# Patient Record
Sex: Male | Born: 1990 | Race: White | Hispanic: No | Marital: Single | Smoking: Never smoker
Health system: Southern US, Community
[De-identification: ages and names within clinical notes are randomized; demographics above are authoritative.]

## PROBLEM LIST (undated history)

## (undated) HISTORY — PX: KNEE SURGERY: SHX244

---
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
## 2011-02-19 ENCOUNTER — Encounter: Payer: Self-pay | Admitting: *Deleted

## 2011-02-19 ENCOUNTER — Emergency Department (HOSPITAL_COMMUNITY)
Admission: EM | Admit: 2011-02-19 | Discharge: 2011-02-19 | Disposition: A | Discharge status: Home / Self Care | Payer: Self-pay | Attending: Emergency Medicine | Admitting: Emergency Medicine

## 2011-02-19 ENCOUNTER — Emergency Department (HOSPITAL_COMMUNITY): Payer: Self-pay

## 2011-02-19 DIAGNOSIS — W230XXA Caught, crushed, jammed, or pinched between moving objects, initial encounter: Secondary | ICD-10-CM | POA: Insufficient documentation

## 2011-02-19 DIAGNOSIS — IMO0001 Reserved for inherently not codable concepts without codable children: Secondary | ICD-10-CM

## 2011-02-19 DIAGNOSIS — S6000XA Contusion of unspecified finger without damage to nail, initial encounter: Secondary | ICD-10-CM | POA: Insufficient documentation

## 2011-02-19 MED ORDER — HYDROCODONE-ACETAMINOPHEN 5-325 MG PO TABS: ORAL_TABLET | ORAL | Status: AC

## 2011-02-19 NOTE — ED Notes (Signed)
PA in with pt at this time  
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
## 2011-10-10 ENCOUNTER — Emergency Department (HOSPITAL_COMMUNITY)
Admission: EM | Admit: 2011-10-10 | Discharge: 2011-10-10 | Disposition: A | Discharge status: Home / Self Care | Payer: Self-pay | Attending: Emergency Medicine | Admitting: Emergency Medicine

## 2011-10-10 ENCOUNTER — Emergency Department (HOSPITAL_COMMUNITY): Payer: Self-pay

## 2011-10-10 ENCOUNTER — Encounter (HOSPITAL_COMMUNITY): Payer: Self-pay | Admitting: *Deleted

## 2011-10-10 DIAGNOSIS — M25519 Pain in unspecified shoulder: Secondary | ICD-10-CM | POA: Insufficient documentation

## 2011-10-10 MED ORDER — NAPROXEN 500 MG PO TABS: 500.0000 mg | ORAL_TABLET | Freq: Two times a day (BID) | ORAL | Status: DC

## 2011-10-10 NOTE — ED Notes (Signed)
MD at bedside. 
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
## 2011-10-10 NOTE — Discharge Instructions (Signed)
Shoulder Pain The shoulder is a ball and socket joint. The muscles and tendons (rotator cuff) are what keep the shoulder in its joint and stable. This collection of muscles and tendons holds in the head (ball) of the humerus (upper arm bone) in the fossa (cup) of the scapula (shoulder blade). Today no reason was found for your shoulder pain. Often pain in the shoulder may be treated conservatively with temporary immobilization. For example, holding the shoulder in one place using a sling for rest. Physical therapy may be needed if problems continue. HOME CARE INSTRUCTIONS   Apply ice to the sore area for 15 to 20 minutes, 3 to 4 times per day for the first 2 days. Put the ice in a plastic bag. Place a towel between the bag of ice and your skin.   If you have or were given a shoulder sling and straps, do not remove for as long as directed by your caregiver or until you see a caregiver for a follow-up examination. If you need to remove it to shower or bathe, move your arm as little as possible.   Sleep on several pillows at night to lessen swelling and pain.   Only take over-the-counter or prescription medicines for pain, discomfort, or fever as directed by your caregiver.   Keep any follow-up appointments in order to avoid any type of permanent shoulder disability or chronic pain problems.  SEEK MEDICAL CARE IF:   Pain in your shoulder increases or new pain develops in your arm, hand, or fingers.   Your hand or fingers are colder than your other hand.   You do not obtain pain relief with the medications or your pain becomes worse.  SEEK IMMEDIATE MEDICAL CARE IF:   Your arm, hand, or fingers are numb or tingling.   Your arm, hand, or fingers are swollen, painful, or turn white or blue.   You develop chest pain or shortness of breath.  MAKE SURE YOU:   Understand these instructions.   Will watch your condition.   Will get help right away if you are not doing well or get worse.    Document Released: 03/07/2005 Document Revised: 05/17/2011 Document Reviewed: 05/12/2011 [employer] Patient Information 2012 [employer]. 
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
## 2011-10-15 NOTE — ED Provider Notes (Signed)
Medical screening examination/treatment/procedure(s) were performed by non-physician practitioner and as supervising physician I was immediately available for consultation/collaboration.    Benny Lennert, MD 10/15/11 [phone]

## 2011-11-25 ENCOUNTER — Encounter (HOSPITAL_COMMUNITY): Payer: Self-pay

## 2011-11-25 ENCOUNTER — Emergency Department (HOSPITAL_COMMUNITY): Payer: Medicaid Other

## 2011-11-25 ENCOUNTER — Emergency Department (HOSPITAL_COMMUNITY)
Admission: EM | Admit: 2011-11-25 | Discharge: 2011-11-25 | Disposition: A | Discharge status: Home / Self Care | Payer: Medicaid Other | Attending: Emergency Medicine | Admitting: Emergency Medicine

## 2011-11-25 DIAGNOSIS — S92309A Fracture of unspecified metatarsal bone(s), unspecified foot, initial encounter for closed fracture: Secondary | ICD-10-CM | POA: Insufficient documentation

## 2011-11-25 DIAGNOSIS — Z880 Allergy status to penicillin: Secondary | ICD-10-CM | POA: Insufficient documentation

## 2011-11-25 MED ORDER — HYDROCODONE-ACETAMINOPHEN 5-325 MG PO TABS: ORAL_TABLET | ORAL | Status: DC

## 2011-11-25 MED ORDER — IBUPROFEN 800 MG PO TABS: 800.0000 mg | ORAL_TABLET | Freq: Three times a day (TID) | ORAL | Status: DC

## 2011-11-25 MED ORDER — HYDROCODONE-ACETAMINOPHEN 5-325 MG PO TABS
1.0000 | ORAL_TABLET | Freq: Once | ORAL | Status: AC
  Administered 2011-11-25: 1 via ORAL
  Filled 2011-11-25: qty 1

## 2011-11-25 MED ORDER — BACITRACIN ZINC 500 UNIT/GM EX OINT
TOPICAL_OINTMENT | CUTANEOUS | Status: AC
  Filled 2011-11-25: qty 0.9

## 2011-11-25 MED ORDER — DOUBLE ANTIBIOTIC 500-10000 UNIT/GM EX OINT: TOPICAL_OINTMENT | Freq: Once | CUTANEOUS | Status: DC

## 2011-11-25 MED ORDER — IBUPROFEN 800 MG PO TABS
800.0000 mg | ORAL_TABLET | Freq: Once | ORAL | Status: AC
  Administered 2011-11-25: 800 mg via ORAL
  Filled 2011-11-25: qty 1
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
## 2011-11-25 NOTE — Discharge Instructions (Signed)
Foot Fracture Your caregiver has diagnosed you as having a foot fracture (broken bone). Your foot has many bones. You have a fracture, or break, in one of these bones. In some cases, your doctor may put on a splint or removable fracture boot until the swelling in your foot has lessened. A cast may or may not be required. HOME CARE INSTRUCTIONS  If you do not have a cast or splint:  You may bear weight on your injured foot as tolerated or advised.   Do not put any weight on your injured foot for as long as directed by your caregiver. Slowly increase the amount of time you walk on the foot as the pain and swelling allows or as advised.   Use crutches until you can bear weight without pain. A gradual increase in weight bearing may help.   Apply ice to the injury for 15 to 20 minutes each hour while awake for the first 2 days. Put the ice in a plastic bag and place a towel between the bag of ice and your skin.   If an ace bandage (stretchy, elastic wrapping bandage) was applied, you may re-wrap it if ankle is more painful or your toes become cold and swollen.  If you have a cast or splint:  Use your crutches for as long as directed by your caregiver.   To lessen the swelling, keep the injured foot elevated on pillows while lying down or sitting. Elevate your foot above your heart.   Apply ice to the injury for 15 to 20 minutes each hour while awake for the first 2 days. Put the ice in a plastic bag and place a thin towel between the bag of ice and your cast.   Plaster or fiberglass cast:   Do not try to scratch the skin under the cast using a sharp or pointed object down the cast.   Check the skin around the cast every day. You may put lotion on any red or sore areas.   Keep your cast clean and dry.   Plaster splint:   Wear the splint until you are seen for a follow-up examination.   You may loosen the elastic around the splint if your toes become numb, tingle, or turn blue or cold. Do  not rest it on anything harder than a pillow in the first 24 hours.   Do not put pressure on any part of your splint. Use your crutches as directed.   Keep your splint dry. It can be protected during bathing with a plastic bag. Do not lower the splint into water.   If you have a fracture boot you may remove it to shower. Bear weight only as instructed by your caregiver.   Only take over-the-counter or prescription medicines for pain, discomfort, or fever as directed by your caregiver.  SEEK IMMEDIATE MEDICAL CARE IF:   Your cast gets damaged or breaks.   You have continued severe pain or more swelling than you did before the cast was put on.   Your skin or nails of your casted foot turn blue, gray, feel cold or numb.   There is a bad smell from your cast.   There is severe pain with movement of your toes.   There are new stains and/or drainage coming from under the cast.  MAKE SURE YOU:   Understand these instructions.   Will watch your condition.   Will get help right away if you are not doing well or get   worse.  Document Released: 05/25/2000 Document Revised: 05/17/2011 Document Reviewed: 07/01/2008 [employer] Patient Information 2012 [employer]. 
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
## 2011-11-27 ENCOUNTER — Encounter: Payer: Self-pay | Admitting: Orthopedic Surgery

## 2011-11-27 ENCOUNTER — Ambulatory Visit (INDEPENDENT_AMBULATORY_CARE_PROVIDER_SITE_OTHER): Payer: Medicaid Other | Admitting: Orthopedic Surgery

## 2011-11-27 VITALS — BP 90/60 | Ht 68.0 in | Wt 140.0 lb

## 2011-11-27 DIAGNOSIS — S62309A Unspecified fracture of unspecified metacarpal bone, initial encounter for closed fracture: Secondary | ICD-10-CM

## 2011-11-27 DIAGNOSIS — S62308A Unspecified fracture of other metacarpal bone, initial encounter for closed fracture: Secondary | ICD-10-CM | POA: Insufficient documentation

## 2011-11-27 MED ORDER — HYDROCODONE-ACETAMINOPHEN 5-325 MG PO TABS: 1.0000 | ORAL_TABLET | ORAL | Status: AC | PRN

## 2011-11-27 MED ORDER — IBUPROFEN 800 MG PO TABS: 800.0000 mg | ORAL_TABLET | Freq: Three times a day (TID) | ORAL | Status: AC
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
## 2011-11-27 NOTE — Patient Instructions (Addendum)
NO WEIGHT BEARING   KEEP ELEVATED   USE CRUTCHES   CONTINUE HYDROCODONE AND IBUPROFEN

## 2011-11-29 NOTE — ED Provider Notes (Signed)
Medical screening examination/treatment/procedure(s) were performed by non-physician practitioner and as supervising physician I was immediately available for consultation/collaboration.   Natoya Viscomi B. Astraea Gaughran, MD 11/29/11 1906 

## 2011-11-30 ENCOUNTER — Other Ambulatory Visit: Payer: Self-pay | Admitting: Family Medicine

## 2011-11-30 DIAGNOSIS — S92909A Unspecified fracture of unspecified foot, initial encounter for closed fracture: Secondary | ICD-10-CM

## 2011-12-04 ENCOUNTER — Ambulatory Visit (HOSPITAL_COMMUNITY): Payer: Medicaid Other

## 2011-12-05 ENCOUNTER — Other Ambulatory Visit (HOSPITAL_COMMUNITY): Payer: Medicaid Other

## 2011-12-05 ENCOUNTER — Other Ambulatory Visit: Payer: Self-pay | Admitting: Family Medicine

## 2011-12-05 ENCOUNTER — Inpatient Hospital Stay
Admission: RE | Admit: 2011-12-05 | Discharge: 2011-12-05 | Payer: Medicaid Other | Source: Ambulatory Visit | Attending: Family Medicine | Admitting: Family Medicine

## 2011-12-05 DIAGNOSIS — M79671 Pain in right foot: Secondary | ICD-10-CM

## 2011-12-05 DIAGNOSIS — IMO0002 Reserved for concepts with insufficient information to code with codable children: Secondary | ICD-10-CM

## 2011-12-06 ENCOUNTER — Other Ambulatory Visit: Payer: Medicaid Other

## 2011-12-06 ENCOUNTER — Ambulatory Visit
Admission: RE | Admit: 2011-12-06 | Discharge: 2011-12-06 | Disposition: A | Discharge status: Home / Self Care | Payer: Medicaid Other | Source: Ambulatory Visit | Attending: Family Medicine | Admitting: Family Medicine

## 2011-12-06 DIAGNOSIS — IMO0002 Reserved for concepts with insufficient information to code with codable children: Secondary | ICD-10-CM

## 2011-12-06 DIAGNOSIS — M79671 Pain in right foot: Secondary | ICD-10-CM

## 2011-12-07 ENCOUNTER — Other Ambulatory Visit (HOSPITAL_COMMUNITY): Payer: Medicaid Other

## 2011-12-10 ENCOUNTER — Ambulatory Visit (HOSPITAL_COMMUNITY): Payer: Medicaid Other

## 2011-12-10 ENCOUNTER — Telehealth: Payer: Self-pay | Admitting: Orthopedic Surgery

## 2011-12-10 NOTE — Telephone Encounter (Signed)
Charles Boone mother, Charles Boone called to cancel [name]'s follow-up appointment for 12/11/11.  I asked to speak with [name] but she said he was not there.  Asked if I could reschedule the appointment and she said no.  He went to [name] to see [name] and  [name] referred him to [name].  They were concerned that he did not get a new XR here.

## 2011-12-11 ENCOUNTER — Ambulatory Visit: Payer: Medicaid Other | Admitting: Orthopedic Surgery

## 2012-11-27 ENCOUNTER — Encounter (HOSPITAL_COMMUNITY): Payer: Self-pay | Admitting: *Deleted

## 2012-11-27 ENCOUNTER — Emergency Department (HOSPITAL_COMMUNITY)
Admission: EM | Admit: 2012-11-27 | Discharge: 2012-11-27 | Disposition: A | Discharge status: Home / Self Care | Payer: Medicaid Other | Attending: Emergency Medicine | Admitting: Emergency Medicine

## 2012-11-27 DIAGNOSIS — Z880 Allergy status to penicillin: Secondary | ICD-10-CM | POA: Insufficient documentation

## 2012-11-27 DIAGNOSIS — K5289 Other specified noninfective gastroenteritis and colitis: Secondary | ICD-10-CM | POA: Insufficient documentation

## 2012-11-27 DIAGNOSIS — K529 Noninfective gastroenteritis and colitis, unspecified: Secondary | ICD-10-CM

## 2012-11-27 DIAGNOSIS — R112 Nausea with vomiting, unspecified: Secondary | ICD-10-CM | POA: Insufficient documentation

## 2012-11-27 LAB — COMPREHENSIVE METABOLIC PANEL
ALT: 12 U/L (ref 0–53)
Alkaline Phosphatase: 73 U/L (ref 39–117)
BUN: 11 mg/dL (ref 6–23)
CO2: 28 mEq/L (ref 19–32)
GFR calc Af Amer: >90 mL/min (ref >90)
GFR calc non Af Amer: >90 mL/min (ref >90)
Glucose, Bld: 105 mg/dL — ABNORMAL HIGH (ref 70–99)
Potassium: 4.2 mEq/L (ref 3.5–5.1)
Sodium: 137 mEq/L (ref 135–145)
Total Protein: 7.4 g/dL (ref 6.0–8.3)

## 2012-11-27 LAB — URINALYSIS, ROUTINE W REFLEX MICROSCOPIC
Bilirubin Urine: NEGATIVE
Hgb urine dipstick: NEGATIVE
Ketones, ur: NEGATIVE mg/dL
Nitrite: NEGATIVE
Protein, ur: NEGATIVE mg/dL
Urobilinogen, UA: 0.2 mg/dL (ref 0.0–1.0)

## 2012-11-27 LAB — CBC WITH DIFFERENTIAL/PLATELET
Eosinophils Absolute: 0.1 10*3/uL (ref 0.0–0.7)
Eosinophils Relative: 1 % (ref 0–5)
Hemoglobin: 15.6 g/dL (ref 13.0–17.0)
Lymphocytes Relative: 6 % — ABNORMAL LOW (ref 12–46)
Lymphs Abs: 0.7 10*3/uL (ref 0.7–4.0)
MCH: 30.6 pg (ref 26.0–34.0)
MCV: 87.8 fL (ref 78.0–100.0)
Monocytes Relative: 7 % (ref 3–12)
Neutrophils Relative %: 85 % — ABNORMAL HIGH (ref 43–77)
Platelets: 160 10*3/uL (ref 150–400)
RBC: 5.1 MIL/uL (ref 4.22–5.81)
WBC: 10.7 10*3/uL — ABNORMAL HIGH (ref 4.0–10.5)

## 2012-11-27 LAB — LIPASE, BLOOD: Lipase: 17 U/L (ref 11–59)

## 2012-11-27 MED ORDER — ONDANSETRON 4 MG PO TBDP
ORAL_TABLET | ORAL | Status: AC
  Administered 2012-11-27: 4 mg via ORAL
  Filled 2012-11-27: qty 1

## 2012-11-27 MED ORDER — DICYCLOMINE HCL 10 MG PO CAPS
10.0000 mg | ORAL_CAPSULE | Freq: Once | ORAL | Status: AC
  Administered 2012-11-27: 10 mg via ORAL
  Filled 2012-11-27: qty 1

## 2012-11-27 MED ORDER — ONDANSETRON 4 MG PO TBDP
4.0000 mg | ORAL_TABLET | Freq: Once | ORAL | Status: AC
  Administered 2012-11-27: 4 mg via ORAL

## 2012-11-27 MED ORDER — DICYCLOMINE HCL 20 MG PO TABS: 20.0000 mg | ORAL_TABLET | Freq: Two times a day (BID) | ORAL | Status: AC | PRN

## 2012-11-27 MED ORDER — SODIUM CHLORIDE 0.9 % IV BOLUS (SEPSIS)
1000.0000 mL | Freq: Once | INTRAVENOUS | Status: AC
  Administered 2012-11-27: 1000 mL via INTRAVENOUS

## 2012-11-27 MED ORDER — ONDANSETRON 4 MG PO TBDP: ORAL_TABLET | ORAL | Status: DC

## 2012-11-27 NOTE — ED Provider Notes (Signed)
History  This chart was scribed for Loren Racer, MD by Bennett Scrape, ED Scribe. This patient was seen in room APA08/AP[facility]08 and the patient's care was started at 11:02 AM.  CSN: [id number]  Arrival date & time 11/27/12  1046   First MD Initiated Contact with Patient 11/27/12 1102      Chief Complaint  Patient presents with  . Diarrhea     Patient is a 22 y.o. male presenting with diarrhea. The history is provided by the patient. No language interpreter was used.  Diarrhea Quality:  Watery Severity:  Moderate Number of episodes:  >20 Duration:  6 hours Timing:  Constant Progression:  Unchanged Relieved by:  Nothing Worsened by:  Nothing tried Ineffective treatments:  None tried Associated symptoms: vomiting   Associated symptoms: no abdominal pain, no fever and no myalgias   Risk factors: no sick contacts, no suspicious food intake and no travel to endemic areas     HPI Comments: Charles Boone is a 22 y.o. male who presents to the Emergency Department complaining of more than 20 episodes of diarrhea described as watery that started upon waking around 4 AM today with associated nausea and emesis beginning 2 hours later. Pt reports that he felt at baseline before going to sleep last night and denies any recent sick contacts with similar symptoms or suspect food intakes. He denies any recent out of country travels and denies any prior episodes of the same. He reports one episode of bilateral leg cramping that lasted for 5 minutes this morning but states that the cramps have improved and his legs feel weak currently. He denies fevers, diffuse myalgias, abdominal pain and hematochezia as associated symptoms. Pt does not have a h/o chronic medical conditions and denies smoking and alcohol use.   History reviewed. No pertinent past medical history.  Past Surgical History  Procedure Laterality Date  . Knee surgery      left    No family history on file.  History   Substance Use Topics  . Smoking status: Never Smoker   . Smokeless tobacco: Not on file  . Alcohol Use: No      Review of Systems  Constitutional: Negative for fever.  Gastrointestinal: Positive for nausea, vomiting and diarrhea. Negative for abdominal pain and anal bleeding.  Musculoskeletal: Negative for myalgias.  All other systems reviewed and are negative.    Allergies  Rondec; Amoxicillin; and Penicillins cross reactors  Home Medications   Current Outpatient Rx  Name  Route  Sig  Dispense  Refill  . dicyclomine (BENTYL) 20 MG tablet   Oral   Take 1 tablet (20 mg total) by mouth 2 (two) times daily as needed (abdominal cramping).   20 tablet   0   . ondansetron (ZOFRAN ODT) 4 MG disintegrating tablet      4mg  ODT q4 hours prn nausea/vomit   8 tablet   0     Triage Vitals: BP 117/64  Pulse 78  Temp(Src) 98.3 F (36.8 C) (Oral)  Resp 20  Ht 5\' 7"  (1.702 m)  Wt 150 lb (68.04 kg)  BMI 23.49 kg/m2  SpO2 99%  Physical Exam  Nursing note and vitals reviewed. Constitutional: He is oriented to person, place, and time. He appears well-developed and well-nourished. No distress.  HENT:  Head: Normocephalic and atraumatic.  Moist MM  Eyes: Conjunctivae and EOM are normal.  Neck: Neck supple. No tracheal deviation present.  Cardiovascular: Normal rate and regular rhythm.   Pulmonary/Chest: Effort  normal and breath sounds normal. No respiratory distress.  Abdominal: Soft. There is no tenderness. There is no rebound and no guarding.  Musculoskeletal: Normal range of motion.  Moving all extremities   Neurological: He is alert and oriented to person, place, and time.  Skin: Skin is warm and dry.  Psychiatric: He has a normal mood and affect. His behavior is normal.    ED Course  Procedures (including critical care time)  Medications  sodium chloride 0.9 % bolus 1,000 mL (1,000 mLs Intravenous New Bag/Given 11/27/12 1236)  ondansetron (ZOFRAN-ODT)  disintegrating tablet 4 mg (4 mg Oral Given 11/27/12 1108)  sodium chloride 0.9 % bolus 1,000 mL (0 mLs Intravenous Stopped 11/27/12 1232)  dicyclomine (BENTYL) capsule 10 mg (10 mg Oral Given 11/27/12 1123)    DIAGNOSTIC STUDIES: Oxygen Saturation is 99% on room air, normal by my interpretation.    COORDINATION OF CARE: 11:11 AM-Discussed treatment plan which includes medications, CBC, CMP and UA with pt at bedside and pt agreed to plan.   Labs Reviewed  CBC WITH DIFFERENTIAL - Abnormal; Notable for the following:    WBC 10.7 (*)    Neutrophils Relative % 85 (*)    Neutro Abs 9.1 (*)    Lymphocytes Relative 6 (*)    All other components within normal limits  COMPREHENSIVE METABOLIC PANEL - Abnormal; Notable for the following:    Glucose, Bld 105 (*)    All other components within normal limits  LIPASE, BLOOD  URINALYSIS, ROUTINE W REFLEX MICROSCOPIC   No results found.   1. Gastroenteritis       MDM  I personally performed the services described in this documentation, which was scribed in my presence. The recorded information has been reviewed and is accurate.  Pt feeling much better after IVF's. Return precautions given.  '  Loren Racer, MD 11/27/12 1321

## 2012-11-27 NOTE — ED Notes (Signed)
Nausea , vomiting and diarrhea, just feels bad onset today

## 2012-11-27 NOTE — ED Notes (Signed)
Per Dr's request - patient given water.

## 2012-11-29 ENCOUNTER — Encounter (HOSPITAL_COMMUNITY): Payer: Self-pay | Admitting: Emergency Medicine

## 2012-11-29 ENCOUNTER — Emergency Department (HOSPITAL_COMMUNITY): Payer: Medicaid Other

## 2012-11-29 ENCOUNTER — Emergency Department (HOSPITAL_COMMUNITY)
Admission: EM | Admit: 2012-11-29 | Discharge: 2012-11-29 | Disposition: A | Discharge status: Home / Self Care | Payer: Medicaid Other | Attending: Emergency Medicine | Admitting: Emergency Medicine

## 2012-11-29 DIAGNOSIS — S139XXA Sprain of joints and ligaments of unspecified parts of neck, initial encounter: Secondary | ICD-10-CM | POA: Insufficient documentation

## 2012-11-29 DIAGNOSIS — S161XXA Strain of muscle, fascia and tendon at neck level, initial encounter: Secondary | ICD-10-CM

## 2012-11-29 DIAGNOSIS — Z880 Allergy status to penicillin: Secondary | ICD-10-CM | POA: Insufficient documentation

## 2012-11-29 DIAGNOSIS — Y9389 Activity, other specified: Secondary | ICD-10-CM | POA: Insufficient documentation

## 2012-11-29 DIAGNOSIS — Y92410 Unspecified street and highway as the place of occurrence of the external cause: Secondary | ICD-10-CM | POA: Insufficient documentation

## 2012-11-29 DIAGNOSIS — IMO0002 Reserved for concepts with insufficient information to code with codable children: Secondary | ICD-10-CM | POA: Insufficient documentation

## 2012-11-29 MED ORDER — NAPROXEN 500 MG PO TABS: 500.0000 mg | ORAL_TABLET | Freq: Two times a day (BID) | ORAL | Status: DC

## 2012-11-29 MED ORDER — CYCLOBENZAPRINE HCL 10 MG PO TABS
10.0000 mg | ORAL_TABLET | Freq: Two times a day (BID) | ORAL | Status: DC | PRN
Start: 2012-11-29 — End: 2014-06-18
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
## 2014-06-07 ENCOUNTER — Encounter (HOSPITAL_COMMUNITY): Payer: Self-pay | Admitting: *Deleted

## 2014-06-07 ENCOUNTER — Emergency Department (HOSPITAL_COMMUNITY): Payer: Medicaid Other

## 2014-06-07 ENCOUNTER — Emergency Department (HOSPITAL_COMMUNITY)
Admission: EM | Admit: 2014-06-07 | Discharge: 2014-06-07 | Disposition: A | Discharge status: Home / Self Care | Payer: Medicaid Other | Attending: Emergency Medicine | Admitting: Emergency Medicine

## 2014-06-07 DIAGNOSIS — Z79899 Other long term (current) drug therapy: Secondary | ICD-10-CM | POA: Insufficient documentation

## 2014-06-07 DIAGNOSIS — L03012 Cellulitis of left finger: Secondary | ICD-10-CM | POA: Insufficient documentation

## 2014-06-07 DIAGNOSIS — Z791 Long term (current) use of non-steroidal anti-inflammatories (NSAID): Secondary | ICD-10-CM | POA: Insufficient documentation

## 2014-06-07 DIAGNOSIS — M79645 Pain in left finger(s): Secondary | ICD-10-CM | POA: Insufficient documentation

## 2014-06-07 DIAGNOSIS — Z880 Allergy status to penicillin: Secondary | ICD-10-CM | POA: Insufficient documentation

## 2014-06-07 DIAGNOSIS — Z792 Long term (current) use of antibiotics: Secondary | ICD-10-CM | POA: Insufficient documentation

## 2014-06-07 MED ORDER — CLINDAMYCIN HCL 300 MG PO CAPS: 300.0000 mg | ORAL_CAPSULE | Freq: Four times a day (QID) | ORAL | Status: DC

## 2014-06-07 MED ORDER — CLINDAMYCIN HCL 150 MG PO CAPS
300.0000 mg | ORAL_CAPSULE | Freq: Once | ORAL | Status: AC
  Administered 2014-06-07: 300 mg via ORAL
  Filled 2014-06-07: qty 2

## 2014-06-07 NOTE — ED Notes (Signed)
Pt alert & oriented x4, stable gait. Patient given discharge instructions, paperwork & prescription(s). Patient  instructed to stop at the registration desk to finish any additional paperwork. Patient verbalized understanding. Pt left department w/ no further questions. 

## 2014-06-07 NOTE — ED Notes (Signed)
Pt noticed left 4th finger red & swollen yesterday. Thought it might have a piece of wood in it.

## 2014-06-07 NOTE — ED Provider Notes (Signed)
CSN: [id number]     Arrival date & time 06/07/14  0358 History   First MD Initiated Contact with Patient 06/07/14 [phone]     Chief Complaint  Patient presents with  . Hand Pain     (Consider location/radiation/quality/duration/timing/severity/associated sxs/prior Treatment) HPI  23 year old male presents with 1 day history of left ring finger redness. Had a piece of wood/splinter in his finger that his girlfriend took out, he is concerned that there is retained wood in there. Has had swelling and decreased ROM. No fevers. No numbness or weakness. Mild/moderate pain.   History reviewed. No pertinent past medical history. Past Surgical History  Procedure Laterality Date  . Knee surgery      left   History reviewed. No pertinent family history. History  Substance Use Topics  . Smoking status: Never Smoker   . Smokeless tobacco: Not on file  . Alcohol Use: No    Review of Systems  Constitutional: Negative for fever.  Musculoskeletal: Positive for joint swelling and arthralgias.  Skin: Positive for color change and wound.  Neurological: Negative for weakness and numbness.  All other systems reviewed and are negative.     Allergies  Rondec; Amoxicillin; and Penicillins cross reactors  Home Medications   Prior to Admission medications   Medication Sig Start Date End Date Taking? Authorizing Provider  clindamycin (CLEOCIN) 300 MG capsule Take 1 capsule (300 mg total) by mouth 4 (four) times daily. X 7 days 06/07/14   Audree CamelScott T Bryssa Tones, MD  cyclobenzaprine (FLEXERIL) 10 MG tablet Take 1 tablet (10 mg total) by mouth 2 (two) times daily as needed for muscle spasms. 11/29/12   Donnetta HutchingBrian Cook, MD  dicyclomine (BENTYL) 20 MG tablet Take 1 tablet (20 mg total) by mouth 2 (two) times daily as needed (abdominal cramping). 11/27/12   Loren Raceravid Yelverton, MD  naproxen (NAPROSYN) 500 MG tablet Take 1 tablet (500 mg total) by mouth 2 (two) times daily. 11/29/12   Donnetta HutchingBrian Cook, MD  ondansetron (ZOFRAN  ODT) 4 MG disintegrating tablet 4mg  ODT q4 hours prn nausea/vomit 11/27/12   Loren Raceravid Yelverton, MD   BP 117/68 mmHg  Pulse 53  Temp(Src) 98.1 F (36.7 C) (Oral)  Resp 16  Ht 5\' 8"  (1.727 m)  Wt 170 lb (77.111 kg)  BMI 25.85 kg/m2  SpO2 100% Physical Exam  Constitutional: He is oriented to person, place, and time. He appears well-developed and well-nourished.  HENT:  Head: Normocephalic and atraumatic.  Neck: Neck supple.  Cardiovascular: Intact distal pulses.   Pulses:      Radial pulses are 2+ on the left side.  Pulmonary/Chest: Effort normal.  Musculoskeletal:       Left hand: He exhibits decreased range of motion (mild), tenderness and swelling. He exhibits normal capillary refill and no laceration. Normal sensation noted. Normal strength noted.       Hands: Neurological: He is alert and oriented to person, place, and time.  Skin: Skin is warm and dry. There is erythema.  Nursing note and vitals reviewed.   ED Course  Procedures (including critical care time) Labs Review Labs Reviewed - No data to display  Imaging Review Dg Finger Ring Left  06/07/2014   CLINICAL DATA:  Left finger pain with redness and swelling. Initial encounter.  EXAM: LEFT RING FINGER 2+V  COMPARISON:  None.  FINDINGS: There is no evidence of fracture or dislocation. No radiopaque foreign body or subcutaneous gas.  IMPRESSION: Negative.   Electronically Signed   By: Christiane HaJonathan  Watts M.D.   On: 06/07/2014 06:06     EKG Interpretation None      MDM   Final diagnoses:  Finger pain, left  Cellulitis of ring finger, left    Patient NV intact. Mild cellulitis surrounding small puncture wound. Xray negative. U/S by me at bedside shows no obvious foreign body. Discussed there could be small retained FB unseen, and will recommend close f/u with hand surgery. Will treat with antibiotics for cellulitis, exam not c/w abscess.     Audree CamelScott T Arcadia Gorgas, MD 06/07/14 [phone]

## 2014-06-07 NOTE — Discharge Instructions (Signed)

## 2014-06-08 ENCOUNTER — Encounter (HOSPITAL_COMMUNITY): Payer: Self-pay | Admitting: Adult Health

## 2014-06-08 ENCOUNTER — Emergency Department (HOSPITAL_COMMUNITY)
Admission: EM | Admit: 2014-06-08 | Discharge: 2014-06-09 | Discharge status: Against Medical Advice | Payer: No Typology Code available for payment source | Attending: Emergency Medicine | Admitting: Emergency Medicine

## 2014-06-08 NOTE — ED Notes (Signed)
Presents with left ring finger cellulitis began clindamycin today and has taken 3, feels like he is allergic to the antibiotic and red streaks began up the hand. [facility]

## 2014-06-08 NOTE — ED Notes (Signed)
Patient LWBS, stated he had to go to work in the AM.

## 2014-06-17 ENCOUNTER — Encounter (HOSPITAL_COMMUNITY): Payer: Self-pay | Admitting: *Deleted

## 2014-06-17 ENCOUNTER — Emergency Department (HOSPITAL_COMMUNITY)
Admission: EM | Admit: 2014-06-17 | Discharge: 2014-06-18 | Disposition: A | Discharge status: Home / Self Care | Payer: No Typology Code available for payment source | Attending: Emergency Medicine | Admitting: Emergency Medicine

## 2014-06-17 DIAGNOSIS — IMO0001 Reserved for inherently not codable concepts without codable children: Secondary | ICD-10-CM

## 2014-06-17 DIAGNOSIS — L03012 Cellulitis of left finger: Secondary | ICD-10-CM | POA: Insufficient documentation

## 2014-06-17 DIAGNOSIS — Z880 Allergy status to penicillin: Secondary | ICD-10-CM | POA: Insufficient documentation

## 2014-06-17 NOTE — ED Notes (Signed)
Pt here for pain to left ring finger, recently here for infection to joint to finger

## 2014-06-17 NOTE — ED Provider Notes (Signed)
CSN: [id number]     Arrival date & time 06/17/14  2144 History   First MD Initiated Contact with Patient 06/17/14 2322     Chief Complaint  Patient presents with  . Hand Injury     (Consider location/radiation/quality/duration/timing/severity/associated sxs/prior Treatment) Patient is a 24 y.o. male presenting with hand injury. The history is provided by the patient.  Hand Injury Location:  Finger Time since incident:  10 days Injury: yes   Mechanism of injury comment:  Splinter in finger Finger location:  R ring finger Pain details:    Quality:  Burning   Radiates to:  L upper arm   Severity:  Moderate   Onset quality:  Gradual   Timing:  Constant   Progression:  Worsening Handedness:  Right-handed Dislocation: no   Foreign body present:  Wood Tetanus status:  Up to date Prior injury to area:  No Relieved by:  Nothing Worsened by:  Movement Ineffective treatments:  None tried Associated symptoms: swelling    Lonia Chimeraathaniel R Cadavid is a 24 y.o. male who presents to the ED with pain to the left ring finger. Patient states he was here 06/07/14 after he got a wood splinter in his finger and it got infected.  He was treated with Clindamycin and the area got better until this morning when he noted the area getting red again and swelling. He started running fever and now has pain going up his arm and swollen tender lymph nodes. He reports fever and chills today. 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   History reviewed. No pertinent past medical history. Past Surgical History  Procedure Laterality Date  . Knee surgery      left   History reviewed. No pertinent family history. History  Substance Use Topics  . Smoking status: Never Smoker   . Smokeless tobacco: Not on file  . Alcohol Use: No    Review of Systems Negative except as stated in HPI   Allergies  Rondec; Amoxicillin; and Penicillins cross reactors  Home Medications   Prior to Admission  medications   Medication Sig Start Date End Date Taking? Authorizing Provider  clindamycin (CLEOCIN) 300 MG capsule Take 1 capsule (300 mg total) by mouth 4 (four) times daily. 06/18/14   Hope Orlene OchM Neese, NP  dicyclomine (BENTYL) 20 MG tablet Take 1 tablet (20 mg total) by mouth 2 (two) times daily as needed (abdominal cramping). Patient not taking: Reported on 06/17/2014 11/27/12   Loren Raceravid Yelverton, MD  HYDROcodone-acetaminophen (NORCO/VICODIN) 5-325 MG per tablet Take 1 tablet by mouth every 4 (four) hours as needed. 06/18/14   Hope Orlene OchM Neese, NP  naproxen (NAPROSYN) 500 MG tablet Take 1 tablet (500 mg total) by mouth 2 (two) times daily. 06/18/14   Hope Orlene OchM Neese, NP   BP 121/64 mmHg  Pulse 95  Temp(Src) 99.7 F (37.6 C) (Oral)  Resp 20  Ht 5\' 8"  (1.727 m)  Wt 180 lb (81.647 kg)  BMI 27.38 kg/m2  SpO2 100% Physical Exam  Constitutional: He is oriented to person, place, and time. He appears well-developed and well-nourished. No distress.  HENT:  Head: Normocephalic.  Eyes: EOM are normal.  Neck: Neck supple.  Cardiovascular: Normal rate.   Pulmonary/Chest: Effort normal.  Abdominal: Soft. There is no tenderness.  Musculoskeletal:       Right hand: He exhibits tenderness and swelling. He exhibits normal capillary refill and no deformity. Decreased range of motion: of left ring finger due to  swelling and pain. Normal sensation noted.  There is pain with range of motion of the left index finger with swelling and erythema. The tenderness extends to the left forearm and upper arm with tenderness of the lymph nodes.  There is no red streaking noted.  Neurological: He is alert and oriented to person, place, and time. No cranial nerve deficit.  Skin: Skin is warm and dry.  Psychiatric: He has a normal mood and affect. His behavior is normal.  Nursing note and vitals reviewed.   ED Course  Procedures  Dr. Preston Fleeting in to examine the patient. Will give IV antibiotic tonight and have patient follow up  tomorrow with ortho. Will give patient Dr. Hilda Lias, ortho here in [place], and Dr. Janee Morn, on call for hand in [place], numbers for follow up.  Results for orders placed or performed during the hospital encounter of 06/17/14 (from the past 24 hour(s))  CBC with Differential     Status: Abnormal   Collection Time: 06/18/14 12:03 AM  Result Value Ref Range   WBC 14.5 (H) 4.0 - 10.5 K/uL   RBC 5.04 4.22 - 5.81 MIL/uL   Hemoglobin 15.0 13.0 - 17.0 g/dL   HCT 45.4 09.8 - 11.9 %   MCV 87.3 78.0 - 100.0 fL   MCH 29.8 26.0 - 34.0 pg   MCHC 34.1 30.0 - 36.0 g/dL   RDW 14.7 82.9 - 56.2 %   Platelets 181 150 - 400 K/uL   Neutrophils Relative % 79 (H) 43 - 77 %   Neutro Abs 11.4 (H) 1.7 - 7.7 K/uL   Lymphocytes Relative 9 (L) 12 - 46 %   Lymphs Abs 1.4 0.7 - 4.0 K/uL   Monocytes Relative 11 3 - 12 %   Monocytes Absolute 1.6 (H) 0.1 - 1.0 K/uL   Eosinophils Relative 1 0 - 5 %   Eosinophils Absolute 0.1 0.0 - 0.7 K/uL   Basophils Relative 0 0 - 1 %   Basophils Absolute 0.0 0.0 - 0.1 K/uL    MDM  24 y.o. male with pain, erythema and swelling to the left ring finger that is recurrent. Stable for discharge without neurovascular deficits and without red streaking noted.   Final diagnoses:  Cellulitis of fourth finger of left hand     Janne Napoleon, NP 06/19/14 1308  Dione Booze, MD 06/23/14 1406

## 2014-06-18 LAB — BASIC METABOLIC PANEL
ANION GAP: 6 (ref 5–15)
BUN: 14 mg/dL (ref 6–23)
CALCIUM: 9.3 mg/dL (ref 8.4–10.5)
CO2: 28 mmol/L (ref 19–32)
Chloride: 100 mEq/L (ref 96–112)
Creatinine, Ser: 0.88 mg/dL (ref 0.50–1.35)
GFR calc Af Amer: >90 mL/min (ref >90)
GFR calc non Af Amer: >90 mL/min (ref >90)
GLUCOSE: 101 mg/dL — AB (ref 70–99)
Potassium: 3.6 mmol/L (ref 3.5–5.1)
Sodium: 134 mmol/L — ABNORMAL LOW (ref 135–145)

## 2014-06-18 LAB — CBC WITH DIFFERENTIAL/PLATELET
BASOS ABS: 0 10*3/uL (ref 0.0–0.1)
BASOS PCT: 0 % (ref 0–1)
Eosinophils Absolute: 0.1 10*3/uL (ref 0.0–0.7)
Eosinophils Relative: 1 % (ref 0–5)
HEMATOCRIT: 44 % (ref 39.0–52.0)
Hemoglobin: 15 g/dL (ref 13.0–17.0)
Lymphocytes Relative: 9 % — ABNORMAL LOW (ref 12–46)
Lymphs Abs: 1.4 10*3/uL (ref 0.7–4.0)
MCH: 29.8 pg (ref 26.0–34.0)
MCHC: 34.1 g/dL (ref 30.0–36.0)
MCV: 87.3 fL (ref 78.0–100.0)
MONOS PCT: 11 % (ref 3–12)
Monocytes Absolute: 1.6 10*3/uL — ABNORMAL HIGH (ref 0.1–1.0)
Neutro Abs: 11.4 10*3/uL — ABNORMAL HIGH (ref 1.7–7.7)
Neutrophils Relative %: 79 % — ABNORMAL HIGH (ref 43–77)
PLATELETS: 181 10*3/uL (ref 150–400)
RBC: 5.04 MIL/uL (ref 4.22–5.81)
RDW: 12.5 % (ref 11.5–15.5)
WBC: 14.5 10*3/uL — AB (ref 4.0–10.5)

## 2014-06-18 MED ORDER — CLINDAMYCIN HCL 300 MG PO CAPS: 300.0000 mg | ORAL_CAPSULE | Freq: Four times a day (QID) | ORAL | Status: AC

## 2014-06-18 MED ORDER — CLINDAMYCIN PHOSPHATE 600 MG/50ML IV SOLN
600.0000 mg | Freq: Once | INTRAVENOUS | Status: AC
  Administered 2014-06-18: 600 mg via INTRAVENOUS
  Filled 2014-06-18: qty 50

## 2014-06-18 MED ORDER — HYDROCODONE-ACETAMINOPHEN 5-325 MG PO TABS: 1.0000 | ORAL_TABLET | ORAL | Status: AC | PRN

## 2014-06-18 MED ORDER — NAPROXEN 500 MG PO TABS: 500.0000 mg | ORAL_TABLET | Freq: Two times a day (BID) | ORAL | Status: AC

## 2014-06-18 MED ORDER — HYDROMORPHONE HCL 1 MG/ML IJ SOLN
1.0000 mg | Freq: Once | INTRAMUSCULAR | Status: AC
  Administered 2014-06-18: 1 mg via INTRAVENOUS
  Filled 2014-06-18: qty 1

## 2014-06-18 NOTE — Discharge Instructions (Signed)
Your hand will need to be rechecked tomorrow. Call Dr. Keeling's office in the morning for a follow up appointment. If he wanSanjuan Damets you to see a hand specialist, call Dr. Janee Mornhompson. Return here as needed.   Cellulitis Cellulitis is an infection of the skin and the tissue beneath it. The infected area is usually red and tender. Cellulitis occurs most often in the arms and lower legs.  CAUSES  Cellulitis is caused by bacteria that enter the skin through cracks or cuts in the skin. The most common types of bacteria that cause cellulitis are staphylococci and streptococci. SIGNS AND SYMPTOMS   Redness and warmth.  Swelling.  Tenderness or pain.  Fever. DIAGNOSIS  Your health care provider can usually determine what is wrong based on a physical exam. Blood tests may also be done. TREATMENT  Treatment usually involves taking an antibiotic medicine. HOME CARE INSTRUCTIONS   Take your antibiotic medicine as directed by your health care provider. Finish the antibiotic even if you start to feel better.  Keep the infected arm or leg elevated to reduce swelling.  Apply a warm cloth to the affected area up to 4 times per day to relieve pain.  Take medicines only as directed by your health care provider.  Keep all follow-up visits as directed by your health care provider. SEEK MEDICAL CARE IF:   You notice red streaks coming from the infected area.  Your red area gets larger or turns dark in color.  Your bone or joint underneath the infected area becomes painful after the skin has healed.  Your infection returns in the same area or another area.  You notice a swollen bump in the infected area.  You develop new symptoms.  You have a fever. SEEK IMMEDIATE MEDICAL CARE IF:   You feel very sleepy.  You develop vomiting or diarrhea.  You have a general ill feeling (malaise) with muscle aches and pains. MAKE SURE YOU:   Understand these instructions.  Will watch your condition.  Will  get help right away if you are not doing well or get worse. Document Released: 03/07/2005 Document Revised: 10/12/2013 Document Reviewed: 08/13/2011 [facility] Patient Information 2015 [place]. This information is not intended to replace advice given to you by your health care provider. Make sure you discuss any questions you have with your health care provider.

## 2014-06-18 NOTE — ED Provider Notes (Signed)
24 year old male had a splinter in his left fourth finger 10 days ago and was seen in the ED and started on clindamycin. X-ray and ultrasound showed no retained foreign body. He had complete resolution of symptoms while on the antibiotic. Antibiotic ran out about 4 days ago and today, he had recurrence of pain and swelling in his finger with pain during open to his forearm. On exam, the left fourth finger is swollen but no obvious erythema. Her no obvious lymphangitic streaks seen. There is no palpable epitrochlear or axillary lymph node. He does not appear toxic. Since he had complete resolution of symptoms while on antibiotics, I feel that he can continue to be treated as an outpatient but he will need referral to hand surgery for evaluation for possible retained foreign body. He is started back on clindamycin and is referred to hand surgery for follow-up. He is to call the hand surgeon's office in the morning.  Medical screening examination/treatment/procedure(s) were conducted as a shared visit with non-physician practitioner(s) and myself.  I personally evaluated the patient during the encounter.    Dione Boozeavid Janijah Symons, MD 06/18/14 0005

## 2015-06-24 ENCOUNTER — Encounter (HOSPITAL_COMMUNITY): Payer: Self-pay | Admitting: Emergency Medicine

## 2015-06-24 ENCOUNTER — Emergency Department (HOSPITAL_COMMUNITY)
Admission: EM | Admit: 2015-06-24 | Discharge: 2015-06-25 | Disposition: A | Discharge status: Home / Self Care | Payer: Medicaid Other | Attending: Emergency Medicine | Admitting: Emergency Medicine

## 2015-06-24 DIAGNOSIS — Z792 Long term (current) use of antibiotics: Secondary | ICD-10-CM | POA: Insufficient documentation

## 2015-06-24 DIAGNOSIS — Z791 Long term (current) use of non-steroidal anti-inflammatories (NSAID): Secondary | ICD-10-CM | POA: Insufficient documentation

## 2015-06-24 DIAGNOSIS — Z880 Allergy status to penicillin: Secondary | ICD-10-CM | POA: Insufficient documentation

## 2015-06-24 DIAGNOSIS — Z7952 Long term (current) use of systemic steroids: Secondary | ICD-10-CM | POA: Insufficient documentation

## 2015-06-24 DIAGNOSIS — L237 Allergic contact dermatitis due to plants, except food: Secondary | ICD-10-CM

## 2015-06-24 DIAGNOSIS — L255 Unspecified contact dermatitis due to plants, except food: Secondary | ICD-10-CM | POA: Insufficient documentation

## 2015-06-24 NOTE — ED Notes (Signed)
Patient states he got poison oak last week. States [place] [facility] [place]. States finished all the steroid but rash came back in his eyes. States right eye lid feels tight. Denies blurry vision. NAD.

## 2015-06-25 MED ORDER — PREDNISONE 50 MG PO TABS
60.0000 mg | ORAL_TABLET | Freq: Once | ORAL | Status: AC
  Administered 2015-06-25: 60 mg via ORAL
  Filled 2015-06-25: qty 1

## 2015-06-25 MED ORDER — DIPHENHYDRAMINE HCL 25 MG PO CAPS
50.0000 mg | ORAL_CAPSULE | Freq: Once | ORAL | Status: AC
  Administered 2015-06-25: 50 mg via ORAL
  Filled 2015-06-25: qty 2

## 2015-06-25 MED ORDER — PREDNISONE 10 MG PO TABS: ORAL_TABLET | ORAL | Status: AC

## 2015-06-25 NOTE — Discharge Instructions (Signed)
Poison Oak Poison oak is an inflammation of the skin (contact dermatitis). It is caused by contact with the allergens on the leaves of the oak (toxicodendron) plants. Depending on your sensitivity, the rash may consist simply of redness and itching, or it may also progress to blisters which may break open (rupture). These must be well cared for to prevent secondary germ (bacterial) infection as these infections can lead to scarring. The eyes may also get puffy. The puffiness is worst in the morning and gets better as the day progresses. Healing is best accomplished by keeping any open areas dry, clean, covered with a bandage, and covered with an antibacterial ointment if needed. Without secondary infection, this dermatitis usually heals without scarring within 2 to 3 weeks without treatment. HOME CARE INSTRUCTIONS When you have been exposed to poison oak, it is very important to thoroughly wash with soap and water as soon as the exposure has been discovered. You have about one half hour to remove the plant resin before it will cause the rash. This cleaning will quickly destroy the oil or antigen on the skin (the antigen is what causes the rash). Wash aggressively under the fingernails as any plant resin still there will continue to spread the rash. Do not rub skin vigorously when washing affected area. Poison oak cannot spread if no oil from the plant remains on your body. Rash that has progressed to weeping sores (lesions) will not spread the rash unless you have not washed thoroughly. It is also important to clean any clothes you have been wearing as they may carry active allergens which will spread the rash, even several days later. Avoidance of the plant in the future is the best measure. Poison oak plants can be recognized by the number of leaves. Generally, poison oak has three leaves with flowering branches on a single stem. Diphenhydramine may be purchased over the counter and used as needed for  itching. Do not drive with this medication if it makes you drowsy. Ask your caregiver about medication for children. SEEK IMMEDIATE MEDICAL CARE IF:   Open areas of the rash develop.  You notice redness extending beyond the area of the rash.  There is a pus like discharge.  There is increased pain.  Other signs of infection develop (such as fever).   This information is not intended to replace advice given to you by your health care provider. Make sure you discuss any questions you have with your health care provider.   Document Released: 12/02/2002 Document Revised: 08/20/2011 Document Reviewed: 11/03/2014 Elsevier Interactive Patient Education 2016 [employer].  

## 2015-06-27 NOTE — ED Provider Notes (Signed)
CSN: [id number]     Arrival date & time 06/24/15  2248 History   First MD Initiated Contact with Patient 06/24/15 2319     Chief Complaint  Patient presents with  . Poison Oak     (Consider location/radiation/quality/duration/timing/severity/associated sxs/prior Treatment) The history is provided by the patient.   Charles Boone is a 25 y.o. 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  He was treated last week while working in [facility] with a 6 day course of prednisone which improved, but flared after completion of the medicine.  Rash is located on his face including right eyelid and brow, forearms and around waistline. He denies fevers, vision changes. There has been no drainage from the rash sites.  He has taken no medicines for itch relief.     History reviewed. No pertinent past medical history. Past Surgical History  Procedure Laterality Date  . Knee surgery      left   No family history on file. Social History  Substance Use Topics  . Smoking status: Never Smoker   . Smokeless tobacco: None  . Alcohol Use: No    Review of Systems  Constitutional: Negative for fever and chills.  Respiratory: Negative for shortness of breath and wheezing.   Skin: Positive for rash.  Neurological: Negative for numbness.      Allergies  Rondec; Amoxicillin; and Penicillins cross reactors  Home Medications   Prior to Admission medications   Medication Sig Start Date End Date Taking? Authorizing Provider  clindamycin (CLEOCIN) 300 MG capsule Take 1 capsule (300 mg total) by mouth 4 (four) times daily. 06/18/14   Hope Orlene Och, NP  dicyclomine (BENTYL) 20 MG tablet Take 1 tablet (20 mg total) by mouth 2 (two) times daily as needed (abdominal cramping). Patient not taking: Reported on 06/17/2014 11/27/12   Loren Racer, MD  HYDROcodone-acetaminophen (NORCO/VICODIN) 5-325 MG per tablet Take 1 tablet by mouth every 4 (four) hours as  needed. 06/18/14   Hope Orlene Och, NP  naproxen (NAPROSYN) 500 MG tablet Take 1 tablet (500 mg total) by mouth 2 (two) times daily. 06/18/14   Hope Orlene Och, NP  predniSONE (DELTASONE) 10 MG tablet Take 6 tabs daily by mouth for 1 day,  Then 5 tabs daily for 2 days,  4 tabs daily for 2 days,  3 tabs daily for 2 days,  2 tabs daily for 2 days,  Then 1 tab daily for 2 days. 06/25/15   Burgess Amor, PA-C   BP 142/84 mmHg  Pulse 68  Temp(Src) 98.4 F (36.9 C) (Oral)  Resp 16  Ht 5\' 8"  (1.727 m)  Wt 79.379 kg  BMI 26.61 kg/m2  SpO2 99% Physical Exam  Constitutional: He appears well-developed and well-nourished. No distress.  HENT:  Head: Normocephalic.  Neck: Neck supple.  Cardiovascular: Normal rate.   Pulmonary/Chest: Effort normal. He has no wheezes.  Musculoskeletal: Normal range of motion. He exhibits no edema.  Skin: Rash noted.  Erythematous patchy rash right eyebrow and upper lid, forearms, dorsal hands, traces on abdomen. Fine blistering, intact, no drainage.    ED Course  Procedures (including critical care time) Labs Review Labs Reviewed - No data to display  Imaging Review No results found. I have personally reviewed and evaluated these images and lab results as part of my medical decision-making.   EKG Interpretation None      MDM   Final diagnoses:  Poison oak  12 day prednisone taper, advised benadryl, cool soaks, gold bond anti itch cream. Discussed cleaning tools to prevent further contact.  Prn f/u anticipated.    Burgess AmorJulie Mahmoud Blazejewski, PA-C 06/27/15 1445  Zadie Rhineonald Wickline, MD 06/28/15 [phone]

## 2016-02-22 ENCOUNTER — Encounter (HOSPITAL_COMMUNITY): Payer: Self-pay | Admitting: Emergency Medicine

## 2016-02-22 ENCOUNTER — Emergency Department (HOSPITAL_COMMUNITY)
Admission: EM | Admit: 2016-02-22 | Discharge: 2016-02-22 | Disposition: A | Discharge status: Home / Self Care | Payer: Medicaid Other | Attending: Dermatology | Admitting: Dermatology

## 2016-02-22 DIAGNOSIS — Z5321 Procedure and treatment not carried out due to patient leaving prior to being seen by health care provider: Secondary | ICD-10-CM | POA: Insufficient documentation

## 2016-02-22 DIAGNOSIS — Z79899 Other long term (current) drug therapy: Secondary | ICD-10-CM | POA: Insufficient documentation

## 2016-02-22 DIAGNOSIS — R109 Unspecified abdominal pain: Secondary | ICD-10-CM | POA: Insufficient documentation

## 2016-02-22 LAB — URINE MICROSCOPIC-ADD ON

## 2016-02-22 LAB — URINALYSIS, ROUTINE W REFLEX MICROSCOPIC
Bilirubin Urine: NEGATIVE
GLUCOSE, UA: NEGATIVE mg/dL
HGB URINE DIPSTICK: NEGATIVE
Ketones, ur: NEGATIVE mg/dL
LEUKOCYTES UA: NEGATIVE
Nitrite: NEGATIVE
Protein, ur: 30 mg/dL — AB
SPECIFIC GRAVITY, URINE: 1.02 (ref 1.005–1.030)
pH: 6.5 (ref 5.0–8.0)

## 2016-02-22 NOTE — ED Triage Notes (Signed)
Pt c/o L flank pain with radiation into his groin that started this evening.

## 2016-12-09 DEATH — deceased

## 2021-01-04 ENCOUNTER — Other Ambulatory Visit (HOSPITAL_COMMUNITY): Payer: Self-pay
